# Patient Record
Sex: Male | Born: 1980 | Race: Black or African American | Hispanic: No | Marital: Married | Smoking: Never smoker
Health system: Southern US, Community
[De-identification: ages and names within clinical notes are randomized; demographics above are authoritative.]

---

## 2022-03-20 ENCOUNTER — Telehealth: Payer: Self-pay

## 2022-03-20 ENCOUNTER — Other Ambulatory Visit: Payer: Self-pay

## 2022-03-20 ENCOUNTER — Ambulatory Visit (INDEPENDENT_AMBULATORY_CARE_PROVIDER_SITE_OTHER)

## 2022-03-20 ENCOUNTER — Ambulatory Visit
Admission: EM | Admit: 2022-03-20 | Discharge: 2022-03-20 | Disposition: A | Discharge status: Home / Self Care | Attending: Emergency Medicine | Admitting: Emergency Medicine

## 2022-03-20 DIAGNOSIS — M5431 Sciatica, right side: Secondary | ICD-10-CM

## 2022-03-20 DIAGNOSIS — M5432 Sciatica, left side: Secondary | ICD-10-CM

## 2022-03-20 DIAGNOSIS — M545 Low back pain, unspecified: Secondary | ICD-10-CM | POA: Diagnosis not present

## 2022-03-20 DIAGNOSIS — M5442 Lumbago with sciatica, left side: Secondary | ICD-10-CM

## 2022-03-20 DIAGNOSIS — M5441 Lumbago with sciatica, right side: Secondary | ICD-10-CM

## 2022-03-20 MED ORDER — ACETAMINOPHEN 500 MG PO TABS: 1000.0000 mg | ORAL_TABLET | Freq: Three times a day (TID) | ORAL | 1 refills | Status: DC

## 2022-03-20 MED ORDER — IBUPROFEN 400 MG PO TABS: 400.0000 mg | ORAL_TABLET | Freq: Three times a day (TID) | ORAL | 1 refills | Status: DC

## 2022-03-20 MED ORDER — ACETAMINOPHEN 500 MG PO TABS: 1000.0000 mg | ORAL_TABLET | Freq: Three times a day (TID) | ORAL | 1 refills | Status: AC

## 2022-03-20 MED ORDER — BACLOFEN 10 MG PO TABS: 10.0000 mg | ORAL_TABLET | Freq: Every day | ORAL | 0 refills | Status: DC

## 2022-03-20 MED ORDER — METHYLPREDNISOLONE 4 MG PO TBPK: ORAL_TABLET | ORAL | 0 refills | Status: DC

## 2022-03-20 MED ORDER — BACLOFEN 10 MG PO TABS: 10.0000 mg | ORAL_TABLET | Freq: Every day | ORAL | 0 refills | Status: AC

## 2022-03-20 MED ORDER — IBUPROFEN 400 MG PO TABS: 400.0000 mg | ORAL_TABLET | Freq: Three times a day (TID) | ORAL | 1 refills | Status: AC

## 2022-03-20 MED ORDER — METHYLPREDNISOLONE 4 MG PO TBPK: ORAL_TABLET | ORAL | 0 refills | Status: AC

## 2022-03-20 MED ORDER — KETOROLAC TROMETHAMINE 60 MG/2ML IM SOLN
60.0000 mg | Freq: Once | INTRAMUSCULAR | Status: AC
  Administered 2022-03-20: 60 mg via INTRAMUSCULAR

## 2022-03-20 NOTE — Telephone Encounter (Signed)
Pharmacy changed per patient request.

## 2022-03-20 NOTE — Discharge Instructions (Addendum)
The x-ray report of your lumbar spine x-ray is still pending.  Per my personal read, you appear to have good preserved disc space bar spine and no signs of any degenerative arthritis or disc disease.  Your x-ray also captured a small portion of your lower thoracic spine which was a little bit concerning for some possible mild arthritic inflammation at T11 and T12.  I did not appreciate any narrowing of your spine on imaging.  The official radiology report will be posted to your MyChart account once it is complete. ? ?The mainstay of therapy for lower back pain is reduction of inflammation and relaxation of tension which is causing inflammation.  Keep in mind, pain always begets more pain. ?  ?During your visit today, you received an injection of ketorolac, high-dose nonsteroidal anti-inflammatory pain medication that should significantly reduce your pain for the next 6 to 8 hours. ?  ?This evening, please begin taking baclofen 10 mg.  This is a highly effective muscle relaxer and antispasmodic which should continue to provide you with relaxation of your tense muscles, allow you to sleep well and to keep your pain under control. ? ?This evening, please also begin taking Tylenol 1000 mg 3 times daily on a scheduled basis.  This will help you stay ahead of your pain.  It is safe to take Tylenol 1000 mg along with methylprednisolone.   ? ?Tomorrow morning, please begin taking methylprednisolone.  Please take 1 full row tablets at once with your breakfast meal.  If you have had significant resolution of your pain before you finish the entire prescription, please feel free to discontinue.  It is not important to finish every ta dose blet. ?  ?When you have finished your methylprednisone Dosepak, please begin taking ibuprofen 400 mg 3 times daily with each dose of Tylenol 1000 mg as needed for pain. ? ?During the day, please make appoint to apply ice to the affected area 4 times daily for 20 minutes each application.  This  can be achieved by using a bag of frozen peas or corn, a Ziploc bag filled with ice and water, or Ziploc bag filled with half rubbing alcohol and half Dawn dish detergent, frozen into a slush.  Please be careful not to apply ice directly to your skin, always place a soft cloth between you and the ice pack. ?   ?You are also welcome to use topical anti-inflammatory creams such as Voltaren gel, capsaicin or Aspercreme as recommended.  These medications are available over-the-counter, please follow manufactures instructions for use.  I have sent a prescription for diclofenac gel to your pharmacy which can be used in any areas that are sore 4 times daily. ?  ?Please consider discussing referral to physical therapy with your primary care provider.  Physical therapist are very good at teasing out the underlying cause of acute lower back pain and helping with prevention of future recurrences. ?  ?Please avoid attempts to stretch or strengthen the affected area until you are feeling completely pain-free.  Attempts to do so will only prolong the healing process. ?  ?If you would like to try to return return to urgent care in the next 2 to 3 days for repeat ketorolac injection, you are welcome to do so. ?  ?I also recommend that you remain out of work for the next several days, I provided you with a note to return to work in 3 days.  If you feel that you need this time extended, please  follow-up with your primary care provider or return to urgent care for reevaluation so that we can provide you with a note for another 3 days. ? ?Thank you for visiting urgent care today.  We appreciate the opportunity to precipitate in her care. ? ?

## 2022-03-20 NOTE — ED Provider Notes (Signed)
?[facility] ? ? ? ?CSN: [id number] ?Arrival date & time: 03/20/22  1029 ?  ? ?HISTORY  ? ?Chief Complaint  ?Patient presents with  ? Back Pain  ? ?HPI ?Donald Lucas is a 41 y.o. male. Pt states Tuesday while working out he felt a muscle pull in his back, he reports feeling a shooting pain from back to legs.  Patient is in the [employer] and states he does a lot of bending, stooping, lifting, pushing and pulling.  Patient states has not tried medications to relieve his symptoms as of yet.  Patient reports prior traumatic injury to his lower back.  Patient states he is never had this pain before.   ? ?The history is provided by the patient.  ?History reviewed. No pertinent past medical history. ?There are no problems to display for this patient. ? ?History reviewed. No pertinent surgical history. ? ?Home Medications   ? ?Prior to Admission medications   ?Not on File  ? ? ?Family History ?History reviewed. No pertinent family history. ?Social History ?Social History  ? ?Tobacco Use  ? Smoking status: Never  ? Smokeless tobacco: Never  ?Vaping Use  ? Vaping Use: Never used  ?Substance Use Topics  ? Alcohol use: Not Currently  ? Drug use: Never  ? ?Allergies   ?Patient has no allergy information on record. ? ?Review of Systems ?Review of Systems ?Pertinent findings noted in history of present illness.  ? ?Physical Exam ?Triage Vital Signs ?ED Triage Vitals  ?Enc Vitals Group  ?   BP 10/18/21 0827 (!) 147/82  ?   Pulse Rate 10/18/21 0827 72  ?   Resp 10/18/21 0827 18  ?   Temp 10/18/21 0827 98.3 ?F (36.8 ?C)  ?   Temp Source 10/18/21 0827 Oral  ?   SpO2 10/18/21 0827 98 %  ?   Weight --   ?   Height --   ?   Head Circumference --   ?   Peak Flow --   ?   Pain Score 10/18/21 0826 5  ?   Pain Loc --   ?   Pain Edu? --   ?   Excl. in GC? --   ?No data found. ? ?Updated Vital Signs ?BP (!) 144/88 (BP Location: Left Arm)   Pulse 82   Temp 97.7 ?F (36.5 ?C) (Oral)   Resp 18   SpO2 96%  ? ?Physical Exam ?Vitals and  nursing note reviewed.  ?Constitutional:   ?   General: He is not in acute distress. ?   Appearance: Normal appearance. He is not ill-appearing.  ?HENT:  ?   Head: Normocephalic and atraumatic.  ?Eyes:  ?   General: Lids are normal.     ?   Right eye: No discharge.     ?   Left eye: No discharge.  ?   Extraocular Movements: Extraocular movements intact.  ?   Conjunctiva/sclera: Conjunctivae normal.  ?   Right eye: Right conjunctiva is not injected.  ?   Left eye: Left conjunctiva is not injected.  ?Neck:  ?   Trachea: Trachea and phonation normal.  ?Cardiovascular:  ?   Rate and Rhythm: Normal rate and regular rhythm.  ?   Pulses: Normal pulses.  ?   Heart sounds: Normal heart sounds. No murmur heard. ?  No friction rub. No gallop.  ?Pulmonary:  ?   Effort: Pulmonary effort is normal. No accessory muscle usage, prolonged expiration or respiratory distress.  ?  Breath sounds: Normal breath sounds. No stridor, decreased air movement or transmitted upper airway sounds. No decreased breath sounds, wheezing, rhonchi or rales.  ?Chest:  ?   Chest wall: No tenderness.  ?Musculoskeletal:  ?   Cervical back: Normal range of motion and neck supple. Normal range of motion.  ?   Comments: Paraspinous muscles at T12/L1 tender to palpation, muscle spasm appreciated  ?Lymphadenopathy:  ?   Cervical: No cervical adenopathy.  ?Skin: ?   General: Skin is warm and dry.  ?   Findings: No erythema or rash.  ?Neurological:  ?   General: No focal deficit present.  ?   Mental Status: He is alert and oriented to person, place, and time.  ?Psychiatric:     ?   Mood and Affect: Mood normal.     ?   Behavior: Behavior normal.  ? ? ?Visual Acuity ?Right Eye Distance:   ?Left Eye Distance:   ?Bilateral Distance:   ? ?Right Eye Near:   ?Left Eye Near:    ?Bilateral Near:    ? ?[facility] Couse / Diagnostics / Procedures:  ?  ?EKG ? ?Radiology ?DG Lumbar Spine Complete ? ?Result Date: 03/20/2022 ?CLINICAL DATA:  Bilateral low back pain with bilateral  sciatica after working out. EXAM: LUMBAR SPINE - COMPLETE 4+ VIEW COMPARISON:  None. FINDINGS: Five non rib-bearing lumbar type vertebral bodies are present. Vertebral body heights in alignment are normal. There is some straightening of the normal lordosis. Disc spaces are maintained. Facet joints are within normal limits bilaterally. IMPRESSION: Negative lumbar spine radiographs. Electronically Signed   By: Marin Roberts M.D.   On: 03/20/2022 13:21   ? ?Procedures ?Procedures (including critical care time) ? ?UC Diagnoses / Final Clinical Impressions(s)   ?I have reviewed the triage vital signs and the nursing notes. ? ?Pertinent labs & imaging results that were available during my care of the patient were reviewed by me and considered in my medical decision making (see chart for details).   ? ?Final diagnoses:  ?Acute bilateral low back pain with bilateral sciatica  ? ?Patient provided with an injection of ketorolac.  X-ray of lumbar spine is normal.  Patient advised to begin tapering dose of methylprednisolone., Take baclofen once daily 1 hour prior to bedtime., Apply ice pack to affected area 4 times daily for 20 minutes each time, Instructions to alternate ice and heat to affected areas 20 minutes each, Apply topical anti-inflammatory creams such as Voltaren gel, Capsaicin and Aspercreme, Avoid stretching or strengthening exercises until pain is completely resolved, and Return to urgent care in the next 2 to 3 days for repeat ketorolac injection if needed ? ? ?ED Prescriptions   ? ? Medication Sig Dispense Auth. Provider  ? acetaminophen (TYLENOL) 500 MG tablet Take 2 tablets (1,000 mg total) by mouth every 8 (eight) hours. 180 tablet Theadora Rama Scales, PA-C  ? methylPREDNISolone (MEDROL DOSEPAK) 4 MG TBPK tablet Take 24 mg on day 1, 20 mg on day 2, 16 mg on day 3, 12 mg on day 4, 8 mg on day 5, 4 mg on day 6. 21 tablet Theadora Rama Scales, PA-C  ? baclofen (LIORESAL) 10 MG tablet Take 1 tablet  (10 mg total) by mouth at bedtime for 7 days. 7 tablet Theadora Rama Scales, PA-C  ? ibuprofen (ADVIL) 400 MG tablet Take 1 tablet (400 mg total) by mouth 3 (three) times daily. 90 tablet Theadora Rama Scales, PA-C  ? ?  ? ?PDMP not reviewed  this encounter. ? ?Pending results:  ?Labs Reviewed - No data to display ? ?Medications Ordered in UC: ?Medications  ?ketorolac (TORADOL) injection 60 mg (60 mg Intramuscular Given 03/20/22 1240)  ? ? ?Disposition Upon Discharge:  ?Condition: stable for discharge home ?Home: take medications as prescribed; routine discharge instructions as discussed; follow up as advised. ? ?Patient presented with an acute illness with associated systemic symptoms and significant discomfort requiring urgent management. In my opinion, this is a condition that a prudent lay person (someone who possesses an average knowledge of health and medicine) may potentially expect to result in complications if not addressed urgently such as respiratory distress, impairment of bodily function or dysfunction of bodily organs.  ? ?Routine symptom specific, illness specific and/or disease specific instructions were discussed with the patient and/or caregiver at length.  ? ?As such, the patient has been evaluated and assessed, work-up was performed and treatment was provided in alignment with urgent care protocols and evidence based medicine.  Patient/parent/caregiver has been advised that the patient may require follow up for further testing and treatment if the symptoms continue in spite of treatment, as clinically indicated and appropriate. ? ?If the patient was tested for COVID-19, Influenza and/or RSV, then the patient/parent/guardian was advised to isolate at home pending the results of his/her diagnostic coronavirus test and potentially longer if they?re positive. I have also advised pt that if his/her COVID-19 test returns positive, it's recommended to self-isolate for at least 10 days after symptoms  first appeared AND until fever-free for 24 hours without fever reducer AND other symptoms have improved or resolved. Discussed self-isolation recommendations as well as instructions for household member/close c

## 2022-03-20 NOTE — ED Triage Notes (Signed)
Pt states Tuesday while working out he felt a muscle pull in his back, he reports feeling a shooting pain from back to legs.  ?

## 2023-03-06 IMAGING — DX DG LUMBAR SPINE COMPLETE 4+V
5 series · 5 of 5 positions shown · non-contrast
Comparison: None.

CLINICAL DATA: Bilateral low back pain with bilateral sciatica
after working out.

EXAM:
LUMBAR SPINE - COMPLETE 4+ VIEW

[lumbar spine ap]
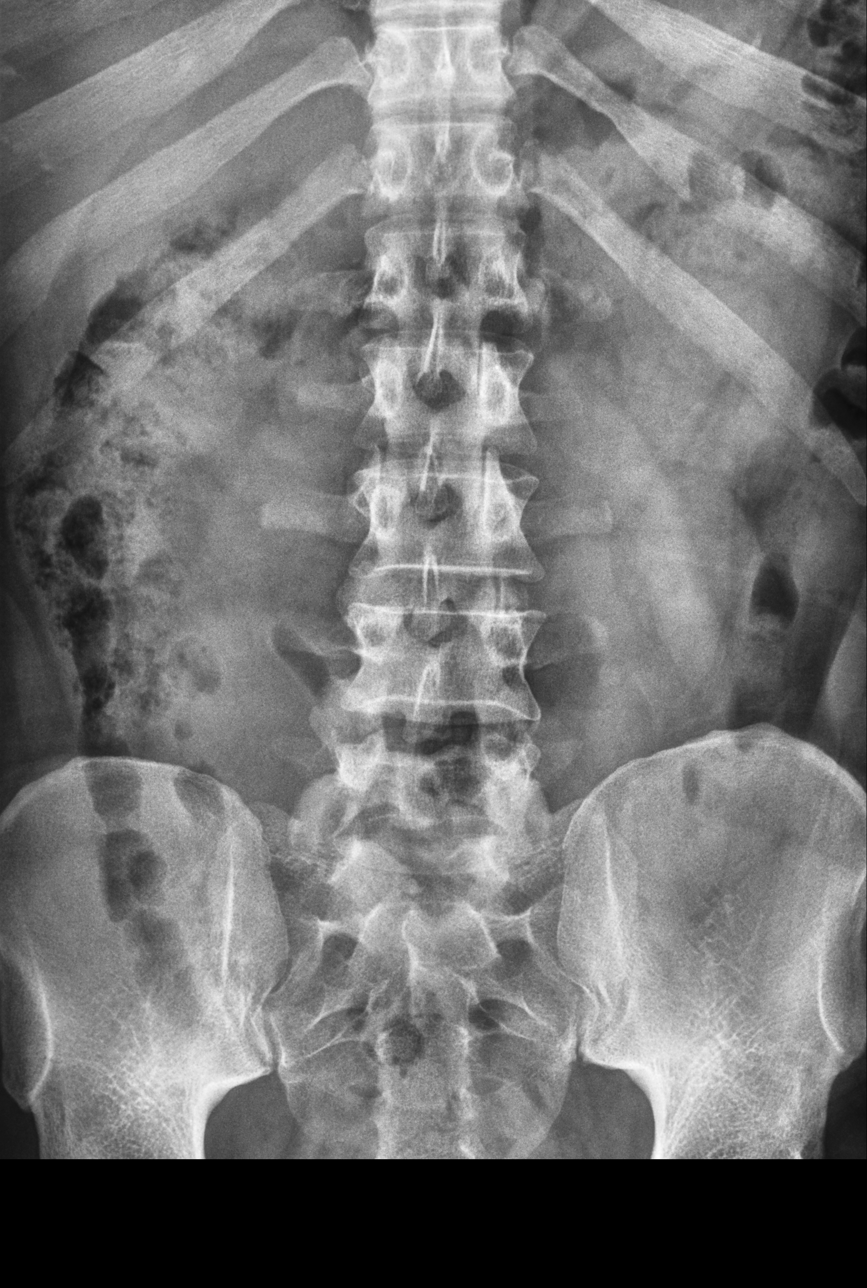

[lumbar spine rpo]
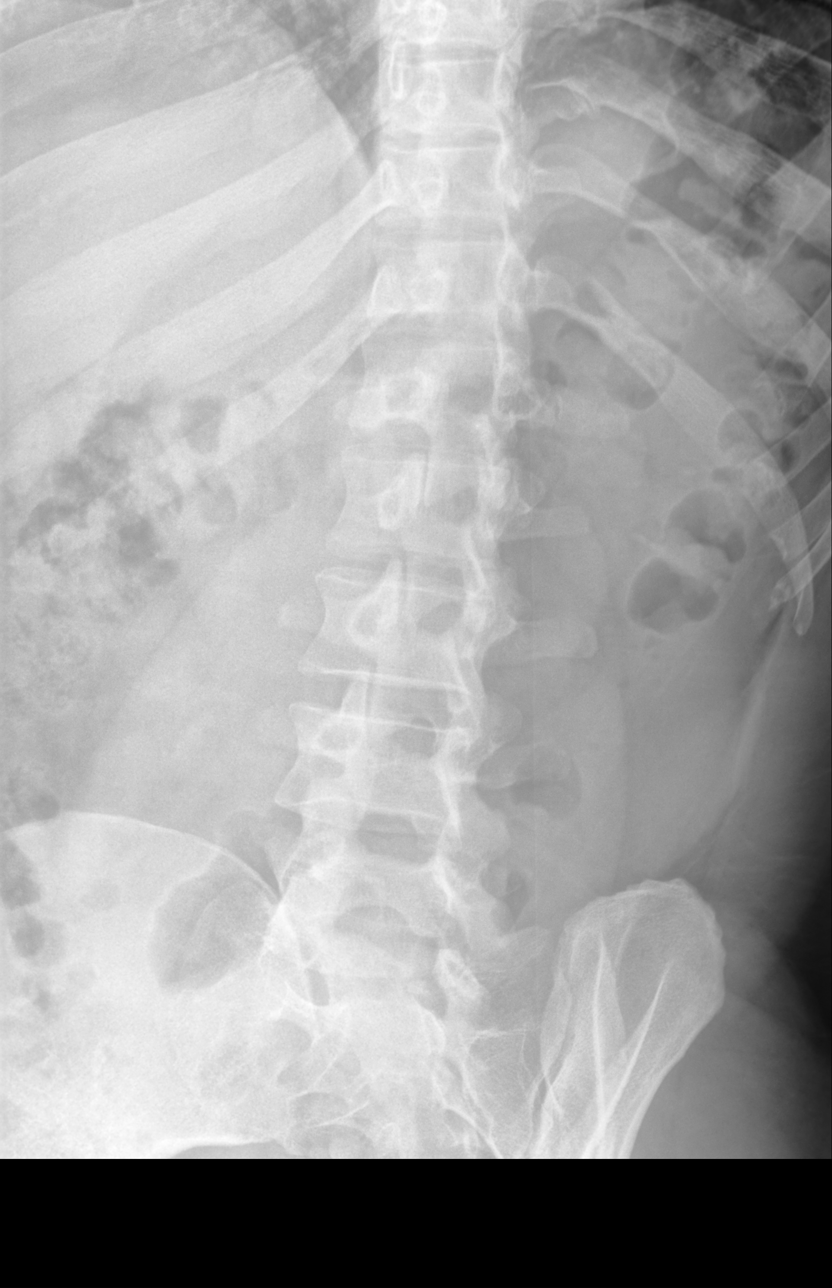

[lumbar spine lpo]
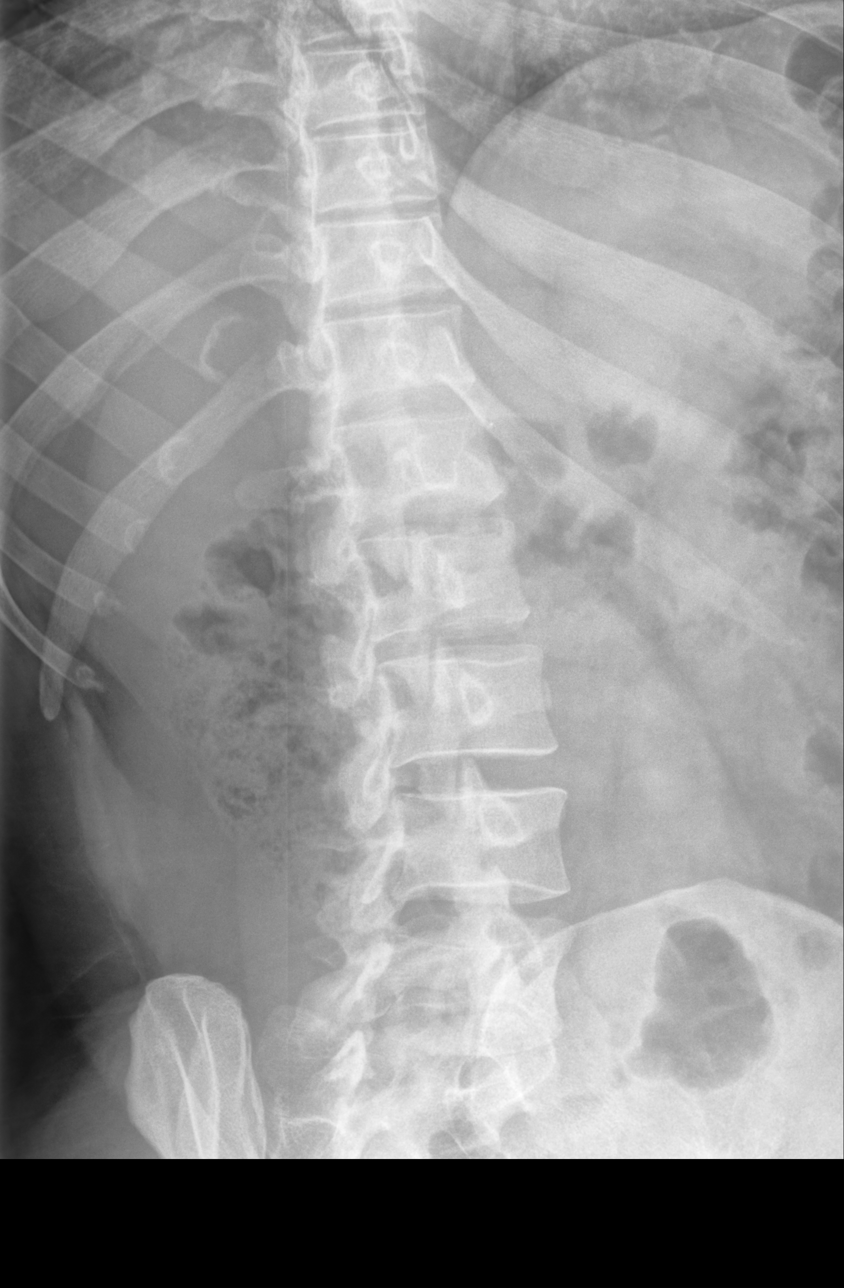

[lumbar spine lat]
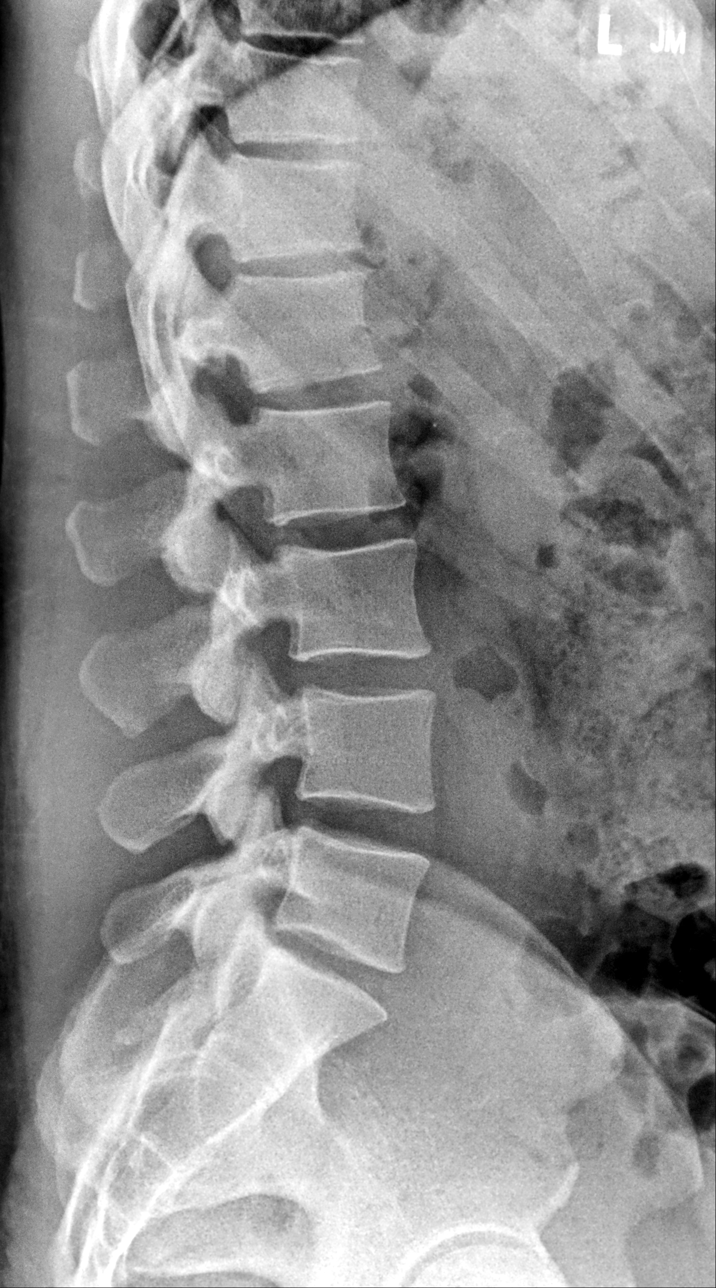

[lumbar spine l5-s1]
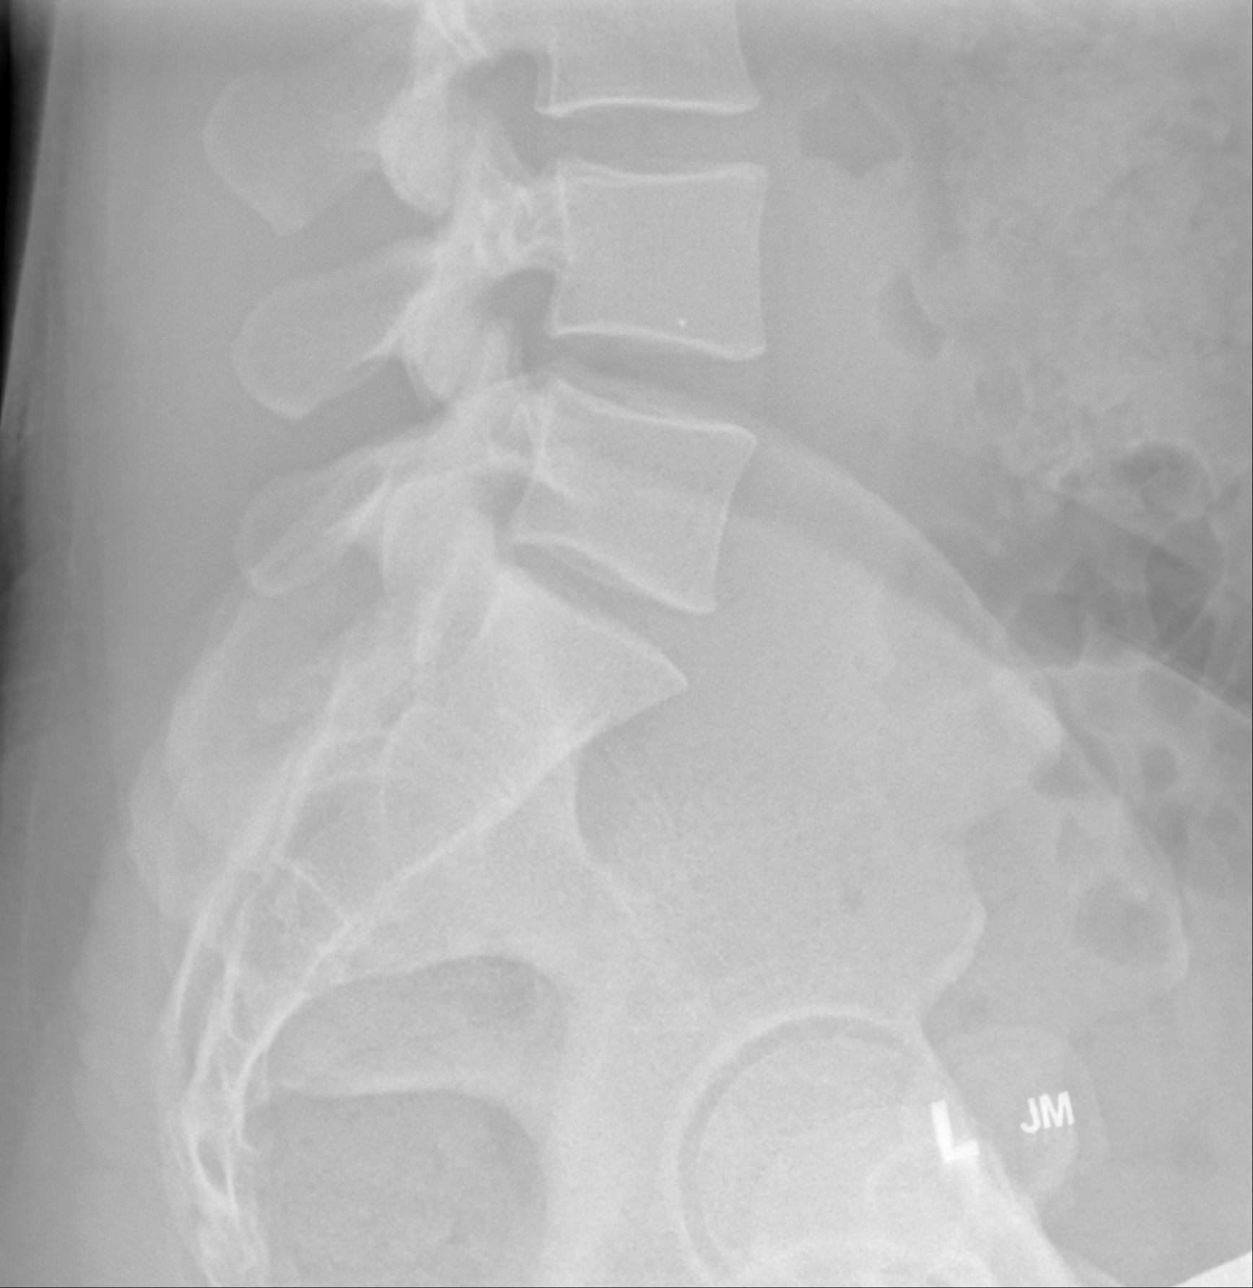

[5 of 5 positions shown; findings below may reference images not displayed]

FINDINGS: Five non rib-bearing lumbar type vertebral bodies are present.
Vertebral body heights in alignment are normal. There is some
straightening of the normal lordosis. Disc spaces are maintained.
Facet joints are within normal limits bilaterally.
IMPRESSION: Negative lumbar spine radiographs.
